# Patient Record
Sex: Female | Born: 1995 | Race: Black or African American | Hispanic: No | Marital: Single | Smoking: Never smoker
Health system: Southern US, Community
[De-identification: ages and names within clinical notes are randomized; demographics above are authoritative.]

## PROBLEM LIST (undated history)

## (undated) DIAGNOSIS — G43909 Migraine, unspecified, not intractable, without status migrainosus: Secondary | ICD-10-CM

## (undated) DIAGNOSIS — F32A Depression, unspecified: Secondary | ICD-10-CM

## (undated) DIAGNOSIS — R51 Headache: Secondary | ICD-10-CM

## (undated) DIAGNOSIS — F419 Anxiety disorder, unspecified: Secondary | ICD-10-CM

## (undated) DIAGNOSIS — E611 Iron deficiency: Secondary | ICD-10-CM

## (undated) DIAGNOSIS — F329 Major depressive disorder, single episode, unspecified: Secondary | ICD-10-CM

## (undated) HISTORY — DX: Major depressive disorder, single episode, unspecified: F32.9

## (undated) HISTORY — DX: Headache: R51

## (undated) HISTORY — DX: Anxiety disorder, unspecified: F41.9

## (undated) HISTORY — DX: Iron deficiency: E61.1

## (undated) HISTORY — PX: HERNIA REPAIR: SHX51

## (undated) HISTORY — PX: SHOULDER ARTHROSCOPY W/ LABRAL REPAIR: SHX2399

## (undated) HISTORY — DX: Depression, unspecified: F32.A

## (undated) HISTORY — DX: Migraine, unspecified, not intractable, without status migrainosus: G43.909

---
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
## 2015-05-03 ENCOUNTER — Encounter (HOSPITAL_COMMUNITY): Payer: Self-pay | Admitting: *Deleted

## 2015-05-03 DIAGNOSIS — Y998 Other external cause status: Secondary | ICD-10-CM | POA: Diagnosis not present

## 2015-05-03 DIAGNOSIS — Y92410 Unspecified street and highway as the place of occurrence of the external cause: Secondary | ICD-10-CM | POA: Insufficient documentation

## 2015-05-03 DIAGNOSIS — Z79899 Other long term (current) drug therapy: Secondary | ICD-10-CM | POA: Diagnosis not present

## 2015-05-03 DIAGNOSIS — Z791 Long term (current) use of non-steroidal anti-inflammatories (NSAID): Secondary | ICD-10-CM | POA: Insufficient documentation

## 2015-05-03 DIAGNOSIS — Y9389 Activity, other specified: Secondary | ICD-10-CM | POA: Diagnosis not present

## 2015-05-03 DIAGNOSIS — Z3202 Encounter for pregnancy test, result negative: Secondary | ICD-10-CM | POA: Insufficient documentation

## 2015-05-03 DIAGNOSIS — S3992XA Unspecified injury of lower back, initial encounter: Secondary | ICD-10-CM | POA: Insufficient documentation

## 2015-05-03 DIAGNOSIS — S3991XA Unspecified injury of abdomen, initial encounter: Secondary | ICD-10-CM | POA: Diagnosis not present

## 2015-05-03 DIAGNOSIS — S199XXA Unspecified injury of neck, initial encounter: Secondary | ICD-10-CM | POA: Insufficient documentation

## 2015-05-03 DIAGNOSIS — S299XXA Unspecified injury of thorax, initial encounter: Secondary | ICD-10-CM | POA: Insufficient documentation
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
## 2015-05-04 ENCOUNTER — Emergency Department (HOSPITAL_COMMUNITY): Payer: Federal, State, Local not specified - PPO

## 2015-05-04 ENCOUNTER — Emergency Department (HOSPITAL_COMMUNITY)
Admission: EM | Admit: 2015-05-04 | Discharge: 2015-05-04 | Disposition: A | Discharge status: Home / Self Care | Payer: Federal, State, Local not specified - PPO | Attending: Emergency Medicine | Admitting: Emergency Medicine

## 2015-05-04 DIAGNOSIS — S199XXA Unspecified injury of neck, initial encounter: Secondary | ICD-10-CM | POA: Diagnosis not present

## 2015-05-04 LAB — URINE MICROSCOPIC-ADD ON

## 2015-05-04 LAB — URINALYSIS, ROUTINE W REFLEX MICROSCOPIC
Bilirubin Urine: NEGATIVE
GLUCOSE, UA: NEGATIVE mg/dL
Ketones, ur: NEGATIVE mg/dL
Leukocytes, UA: NEGATIVE
Nitrite: NEGATIVE
Protein, ur: NEGATIVE mg/dL
SPECIFIC GRAVITY, URINE: 1.009 (ref 1.005–1.030)
Urobilinogen, UA: 0.2 mg/dL (ref 0.0–1.0)
pH: 6 (ref 5.0–8.0)

## 2015-05-04 LAB — POC URINE PREG, ED: PREG TEST UR: NEGATIVE

## 2015-05-04 NOTE — Discharge Instructions (Signed)

## 2015-05-04 NOTE — ED Notes (Signed)
Transported to radiology for CT scan and X-ray.

## 2015-05-04 NOTE — ED Notes (Signed)
Pt A&OX4, ambulatory at d/c with steady gait, NAD 
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
## 2016-07-14 ENCOUNTER — Ambulatory Visit (INDEPENDENT_AMBULATORY_CARE_PROVIDER_SITE_OTHER): Payer: BLUE CROSS/BLUE SHIELD | Admitting: Nurse Practitioner

## 2016-07-14 ENCOUNTER — Encounter: Payer: Self-pay | Admitting: Nurse Practitioner

## 2016-07-14 VITALS — BP 112/66 | HR 64 | Ht 65.75 in | Wt 171.0 lb

## 2016-07-14 DIAGNOSIS — Z0000 Encounter for general adult medical examination without abnormal findings: Secondary | ICD-10-CM

## 2016-07-14 DIAGNOSIS — Z01419 Encounter for gynecological examination (general) (routine) without abnormal findings: Secondary | ICD-10-CM | POA: Diagnosis not present

## 2016-07-14 DIAGNOSIS — N760 Acute vaginitis: Secondary | ICD-10-CM | POA: Diagnosis not present

## 2016-07-14 LAB — POCT URINALYSIS DIPSTICK
BILIRUBIN UA: NEGATIVE
Blood, UA: NEGATIVE
GLUCOSE UA: NEGATIVE
Ketones, UA: NEGATIVE
LEUKOCYTES UA: NEGATIVE
NITRITE UA: NEGATIVE
Protein, UA: NEGATIVE
Urobilinogen, UA: NEGATIVE
pH, UA: 6

## 2016-07-14 MED ORDER — NORETHIN ACE-ETH ESTRAD-FE 1-20 MG-MCG PO TABS: 1.0000 | ORAL_TABLET | Freq: Every day | ORAL | 4 refills | Status: DC

## 2016-07-14 NOTE — Progress Notes (Signed)
Reviewed personally.  M. Suzanne Ryli Standlee, MD.  

## 2016-07-14 NOTE — Progress Notes (Deleted)
Patient ID: Janice Duncan, female   DOB: 1996/04/08, 20 y.o.   MRN: [id number]  20 y.o. G0. Single  African American Fe here for NGYN annual exam. Previously was on Gilldess - Loestrin Fe 1/20.  Menses on the OCP is 4-5 days. Flow is the same off the pill but more irregular at 21 days.  Some cramps, some PMS.  History of migraine better with OCP.  No history of aura.  Last STD's at 3 weeks ago was negative.  Ended last relationship 3 weeks ago and not SA since. She will occasionally have a vaginal odor and wonders if she has BV.  She has had this several times in the past - does not associate infections with time of menses or SA.  Patient's last menstrual period was 06/19/2016 (exact date).          Sexually active: Yes.   3 lifetime partners, sexually active at 74 yo The current method of family planning is condoms all the time.    Exercising: No. No regular exercise.  Works as a Child psychotherapist.  Works out at gym sometimes. Smoker:  Occasional smoker.  Health Maintenance: Pap:  Never per guidelines TDaP: 12/08/15  Gardasil: Injection #06 Apr 2014, needs to complete series - has apt set up to finish immunizations HIV: 06/2016 Labs: done at school  Urine: negative    reports that she has been smoking Cigarettes.  She has never used smokeless tobacco. She reports that she drinks alcohol. She reports that she uses drugs, including Marijuana.  Past Medical History:  Diagnosis Date  . Anxiety   . Depression   . Headache(784.0)    without aura  . Migraine     Past Surgical History:  Procedure Laterality Date  . ABDOMINAL HERNIA REPAIR Bilateral 01/1999   inguinal  . SHOULDER ARTHROSCOPY W/ LABRAL REPAIR Right    2014    Current Outpatient Prescriptions  Medication Sig Dispense Refill  . naproxen (NAPROSYN) 500 MG tablet Take 1 tablet by mouth as needed.    . SUMAtriptan (IMITREX) 50 MG tablet One tablet at the onset of HA, repeat dose in 2 hours if needed max dose  /24hours      No current facility-administered medications for this visit.     Family History  Problem Relation Age of Onset  . Post-traumatic stress disorder Father   . Hypertension Father   . Diabetes Father     prediabetes  . Anxiety disorder Father   . Depression Father   . Diabetes Maternal Grandmother   . Diabetes Paternal Grandmother   . Hypertension Paternal Grandmother   . Lupus Paternal Grandmother   . Depression Paternal Grandmother   . Anxiety disorder Paternal Grandmother   . Pulmonary embolism Sister 20    surgery on leg veins  . Colon cancer Paternal Grandfather   . ADD / ADHD Sister     ROS:  Pertinent items are noted in HPI.  Otherwise, a comprehensive ROS was negative.  Exam:   BP 112/66 (BP Location: Right Arm, Patient Position: Sitting, Cuff Size: Normal)   Pulse 64   Ht 5' 5.75" (1.67 m)   Wt 171 lb (77.6 kg)   LMP 06/19/2016 (Exact Date)   BMI 27.81 kg/m  Height: 5' 5.75" (167 cm) Ht Readings from Last 3 Encounters:  07/14/16 5' 5.75" (1.67 m) (72 %, Z= 0.57)*  09/08/13 5' 6.5" (1.689 m) (82 %, Z= 0.93)*  09/05/13 5' 6.25" (1.683 m) (80 %, Z= 0.83)*   *  Growth percentiles are based on CDC 2-20 Years data.    General appearance: alert, cooperative and appears stated age Head: Normocephalic, without obvious abnormality, atraumatic Neck: no adenopathy, supple, symmetrical, trachea midline and thyroid normal to inspection and palpation Lungs: clear to auscultation bilaterally Breasts: normal appearance, no masses or tenderness Heart: regular rate and rhythm Abdomen: soft, non-tender; no masses,  no organomegaly Extremities: extremities normal, atraumatic, no cyanosis or edema Skin: Skin color, texture, turgor normal. No rashes or lesions Lymph nodes: Cervical, supraclavicular, and axillary nodes normal. No abnormal inguinal nodes palpated Neurologic: Grossly normal   Pelvic: External genitalia:  no lesions              Urethra:  normal appearing urethra  with no masses, tenderness or lesions              Bartholin's and Skene's: normal                 Vagina: normal appearing vagina with normal color and discharge, no lesions              Cervix: anteverted              Pap taken: No. Bimanual Exam:  Uterus:  normal size, contour, position, consistency, mobility, non-tender              Adnexa: no mass, fullness, tenderness               Rectovaginal: Confirms               Anus:  normal sphincter tone, no lesions  Chaperone present: yes  A:  Well Woman with normal exam  Contraception with OCP  Currently off - ran out of pills  Recent STD - 3 wk's ago negative  R/O vaginitis  P:   Reviewed health and wellness pertinent to exam  Pap smear not indicated  New RX for Loestrin Fe 1/20 for a year  Will follow with Affirm test  counseled on breast self exam, STD prevention, HIV risk factors and prevention, use and side effects of OCP's, adequate intake of calcium and vitamin D, diet and exercise return annually or prn  An After Visit Summary was printed and given to the patient.

## 2016-07-14 NOTE — Patient Instructions (Signed)
General topics  Next pap or exam is  due in 1 year Take a Women's multivitamin Take 1200 mg. of calcium daily - prefer dietary If any concerns in interim to call back  Breast Self-Awareness Practicing breast self-awareness may pick up problems early, prevent significant medical complications, and possibly save your life. By practicing breast self-awareness, you can become familiar with how your breasts look and feel and if your breasts are changing. This allows you to notice changes early. It can also offer you some reassurance that your breast health is good. One way to learn what is normal for your breasts and whether your breasts are changing is to do a breast self-exam. If you find a lump or something that was not present in the past, it is best to contact your caregiver right away. Other findings that should be evaluated by your caregiver include nipple discharge, especially if it is bloody; skin changes or reddening; areas where the skin seems to be pulled in (retracted); or new lumps and bumps. Breast pain is seldom associated with cancer (malignancy), but should also be evaluated by a caregiver. BREAST SELF-EXAM The best time to examine your breasts is 5 7 days after your menstrual period is over.  ExitCare Patient Information 2013 [employer].   Exercise to Stay Healthy Exercise helps you become and stay healthy. EXERCISE IDEAS AND TIPS Choose exercises that:  You enjoy.  Fit into your day. You do not need to exercise really hard to be healthy. You can do exercises at a slow or medium level and stay healthy. You can:  Stretch before and after working out.  Try yoga, Pilates, or tai chi.  Lift weights.  Walk fast, swim, jog, run, climb stairs, bicycle, dance, or rollerskate.  Take aerobic classes. Exercises that burn about 150 calories:  Running 1  miles in 15 minutes.  Playing volleyball for 45 to 60 minutes.  Washing and waxing a car for 45 to 60  minutes.  Playing touch football for 45 minutes.  Walking 1  miles in 35 minutes.  Pushing a stroller 1  miles in 30 minutes.  Playing basketball for 30 minutes.  Raking leaves for 30 minutes.  Bicycling 5 miles in 30 minutes.  Walking 2 miles in 30 minutes.  Dancing for 30 minutes.  Shoveling snow for 15 minutes.  Swimming laps for 20 minutes.  Walking up stairs for 15 minutes.  Bicycling 4 miles in 15 minutes.  Gardening for 30 to 45 minutes.  Jumping rope for 15 minutes.  Washing windows or floors for 45 to 60 minutes. Document Released: 12/26/2010 Document Revised: 02/15/2012 Document Reviewed: 12/26/2010 ExitCare Patient Information 2013 [employer].   Other topics ( that may be useful information):    Sexually Transmitted Disease Sexually transmitted disease (STD) refers to any infection that is passed from person to person during sexual activity. This may happen by way of saliva, semen, blood, vaginal mucus, or urine. Common STDs include:  Gonorrhea.  Chlamydia.  Syphilis.  HIV/AIDS.  Genital herpes.  Hepatitis B and C.  Trichomonas.  Human papillomavirus (HPV).  Pubic lice. CAUSES  An STD may be spread by bacteria, virus, or parasite. A person can get an STD by:  Sexual intercourse with an infected person.  Sharing sex toys with an infected person.  Sharing needles with an infected person.  Having intimate contact with the genitals, mouth, or rectal areas of an infected person. SYMPTOMS  Some people may not have any symptoms, but   they can still pass the infection to others. Different STDs have different symptoms. Symptoms include:  Painful or bloody urination.  Pain in the pelvis, abdomen, vagina, anus, throat, or eyes.  Skin rash, itching, irritation, growths, or sores (lesions). These usually occur in the genital or anal area.  Abnormal vaginal discharge.  Penile discharge in men.  Soft, flesh-colored skin growths in the  genital or anal area.  Fever.  Pain or bleeding during sexual intercourse.  Swollen glands in the groin area.  Yellow skin and eyes (jaundice). This is seen with hepatitis. DIAGNOSIS  To make a diagnosis, your caregiver may:  Take a medical history.  Perform a physical exam.  Take a specimen (culture) to be examined.  Examine a sample of discharge under a microscope.  Perform blood test TREATMENT   Chlamydia, gonorrhea, trichomonas, and syphilis can be cured with antibiotic medicine.  Genital herpes, hepatitis, and HIV can be treated, but not cured, with prescribed medicines. The medicines will lessen the symptoms.  Genital warts from HPV can be treated with medicine or by freezing, burning (electrocautery), or surgery. Warts may come back.  HPV is a virus and cannot be cured with medicine or surgery.However, abnormal areas may be followed very closely by your caregiver and may be removed from the cervix, vagina, or vulva through office procedures or surgery. If your diagnosis is confirmed, your recent sexual partners need treatment. This is true even if they are symptom-free or have a negative culture or evaluation. They should not have sex until their caregiver says it is okay. HOME CARE INSTRUCTIONS  All sexual partners should be informed, tested, and treated for all STDs.  Take your antibiotics as directed. Finish them even if you start to feel better.  Only take over-the-counter or prescription medicines for pain, discomfort, or fever as directed by your caregiver.  Rest.  Eat a balanced diet and drink enough fluids to keep your urine clear or pale yellow.  Do not have sex until treatment is completed and you have followed up with your caregiver. STDs should be checked after treatment.  Keep all follow-up appointments, Pap tests, and blood tests as directed by your caregiver.  Only use latex condoms and water-soluble lubricants during sexual activity. Do not use  petroleum jelly or oils.  Avoid alcohol and illegal drugs.  Get vaccinated for HPV and hepatitis. If you have not received these vaccines in the past, talk to your caregiver about whether one or both might be right for you.  Avoid risky sex practices that can break the skin. The only way to avoid getting an STD is to avoid all sexual activity.Latex condoms and dental dams (for oral sex) will help lessen the risk of getting an STD, but will not completely eliminate the risk. SEEK MEDICAL CARE IF:   You have a fever.  You have any new or worsening symptoms. Document Released: 02/13/2003 Document Revised: 02/15/2012 Document Reviewed: 02/20/2011 [facility] Patient Information 2013 [place].    Domestic Abuse You are being battered or abused if someone close to you hits, pushes, or physically hurts you in any way. You also are being abused if you are forced into activities. You are being sexually abused if you are forced to have sexual contact of any kind. You are being emotionally abused if you are made to feel worthless or if you are constantly threatened. It is important to remember that help is available. No one has the right to abuse you. PREVENTION OF FURTHER  ABUSE  Learn the warning signs of danger. This varies with situations but may include: the use of alcohol, threats, isolation from friends and family, or forced sexual contact. Leave if you feel that violence is going to occur.  If you are attacked or beaten, report it to the police so the abuse is documented. You do not have to press charges. The police can protect you while you or the attackers are leaving. Get the officer's name and badge number and a copy of the report.  Find someone you can trust and tell them what is happening to you: your caregiver, a nurse, clergy member, close friend or family member. Feeling ashamed is natural, but remember that you have done nothing wrong. No one deserves abuse. Document Released:  11/20/2000 Document Revised: 02/15/2012 Document Reviewed: 01/29/2011 [facility] Patient Information 2013 [place].    How Much is Too Much Alcohol? Drinking too much alcohol can cause injury, accidents, and health problems. These types of problems can include:   Car crashes.  Falls.  Family fighting (domestic violence).  Drowning.  Fights.  Injuries.  Burns.  Damage to certain organs.  Having a baby with birth defects. ONE DRINK CAN BE TOO MUCH WHEN YOU ARE:  Working.  Pregnant or breastfeeding.  Taking medicines. Ask your doctor.  Driving or planning to drive. If you or someone you know has a drinking problem, get help from a doctor.  Document Released: 09/19/2009 Document Revised: 02/15/2012 Document Reviewed: 09/19/2009 [facility] Patient Information 2013 [place].   Smoking Hazards Smoking cigarettes is extremely bad for your health. Tobacco smoke has over 200 known poisons in it. There are over 60 chemicals in tobacco smoke that cause cancer. Some of the chemicals found in cigarette smoke include:   Cyanide.  Benzene.  Formaldehyde.  Methanol (wood alcohol).  Acetylene (fuel used in welding torches).  Ammonia. Cigarette smoke also contains the poisonous gases nitrogen oxide and carbon monoxide.  Cigarette smokers have an increased risk of many serious medical problems and Smoking causes approximately:  90% of all lung cancer deaths in men.  80% of all lung cancer deaths in women.  90% of deaths from chronic obstructive lung disease. Compared with nonsmokers, smoking increases the risk of:  Coronary heart disease by 2 to 4 times.  Stroke by 2 to 4 times.  Men developing lung cancer by 23 times.  Women developing lung cancer by 13 times.  Dying from chronic obstructive lung diseases by 12 times.  . Smoking is the most preventable cause of death and disease in our society.  WHY IS SMOKING ADDICTIVE?  Nicotine is the chemical  agent in tobacco that is capable of causing addiction or dependence.  When you smoke and inhale, nicotine is absorbed rapidly into the bloodstream through your lungs. Nicotine absorbed through the lungs is capable of creating a powerful addiction. Both inhaled and non-inhaled nicotine may be addictive.  Addiction studies of cigarettes and spit tobacco show that addiction to nicotine occurs mainly during the teen years, when young people begin using tobacco products. WHAT ARE THE BENEFITS OF QUITTING?  There are many health benefits to quitting smoking.   Likelihood of developing cancer and heart disease decreases. Health improvements are seen almost immediately.  Blood pressure, pulse rate, and breathing patterns start returning to normal soon after quitting. QUITTING SMOKING   American Lung Association - 1-800-LUNGUSA  American Cancer Society - 1-800-ACS-2345 Document Released: 12/31/2004 Document Revised: 02/15/2012 Document Reviewed: 09/04/2009 [facility] Patient Information 2013 [place].   Stress Management Stress is a state of physical or mental tension that often results from changes in your life or normal routine. Some common causes of stress are:  Death of a loved one.  Injuries or severe illnesses.  Getting fired or changing jobs.  Moving into a new home. Other causes may be:  Sexual problems.  Business or financial losses.  Taking on a large debt.  Regular conflict with someone at home or at work.  Constant tiredness from lack of sleep. It is not just bad things that are stressful. It may be stressful to:  Win the lottery.  Get married.  Buy a new car. The amount of stress that can be easily tolerated varies from person to person. Changes generally cause stress, regardless of the types of change. Too much stress can affect your health. It may lead to physical or emotional problems. Too little stress (boredom) may also become stressful. SUGGESTIONS TO  REDUCE STRESS:  Talk things over with your family and friends. It often is helpful to share your concerns and worries. If you feel your problem is serious, you may want to get help from a professional counselor.  Consider your problems one at a time instead of lumping them all together. Trying to take care of everything at once may seem impossible. List all the things you need to do and then start with the most important one. Set a goal to accomplish 2 or 3 things each day. If you expect to do too many in a single day you will naturally fail, causing you to feel even more stressed.  Do not use alcohol or drugs to relieve stress. Although you may feel better for a short time, they do not remove the problems that caused the stress. They can also be habit forming.  Exercise regularly - at least 3 times per week. Physical exercise can help to relieve that "uptight" feeling and will relax you.  The shortest distance between despair and hope is often a good night's sleep.  Go to bed and get up on time allowing yourself time for appointments without being rushed.  Take a short "time-out" period from any stressful situation that occurs during the day. Close your eyes and take some deep breaths. Starting with the muscles in your face, tense them, hold it for a few seconds, then relax. Repeat this with the muscles in your neck, shoulders, hand, stomach, back and legs.  Take good care of yourself. Eat a balanced diet and get plenty of rest.  Schedule time for having fun. Take a break from your daily routine to relax. HOME CARE INSTRUCTIONS   Call if you feel overwhelmed by your problems and feel you can no longer manage them on your own.  Return immediately if you feel like hurting yourself or someone else. Document Released: 05/19/2001 Document Revised: 02/15/2012 Document Reviewed: 01/09/2008 ExitCare Patient Information 2013 [employer].  

## 2016-07-15 LAB — WET PREP BY MOLECULAR PROBE
Candida species: NEGATIVE
GARDNERELLA VAGINALIS: NEGATIVE
TRICHOMONAS VAG: NEGATIVE

## 2016-07-15 NOTE — Progress Notes (Signed)
Patient ID: Janice Duncan, female   DOB: 05/07/1996, 19 y.o.   MRN: [id number]  19 y.o. G0. Single  African American Fe here for NGYN annual exam. Previously was on Gilldess - Loestrin Fe 1/20.  Menses on the OCP is 4-5 days. Flow is the same off the pill but more irregular at 21 days.  Some cramps, some PMS.  History of migraine better with OCP.  No history of aura.  Last STD's at 3 weeks ago was negative.  Ended last relationship 3 weeks ago and not SA since. She will occasionally have a vaginal odor and wonders if she has BV.  She has had this several times in the past - does not associate infections with time of menses or SA.  Patient's last menstrual period was 06/19/2016 (exact date).          Sexually active: Yes.   3 lifetime partners, sexually active at 15 yo The current method of family planning is condoms all the time.    Exercising: No. No regular exercise.  Works as a waitress.  Works out at gym sometimes. Smoker:  Occasional smoker.  Health Maintenance: Pap:  Never per guidelines TDaP: 12/08/15  Gardasil: Injection #06 Apr 2014, needs to complete series - has apt set up to finish immunizations HIV: 06/2016 Labs: done at school  Urine: negative    reports that she has been smoking Cigarettes.  She has never used smokeless tobacco. She reports that she drinks alcohol. She reports that she uses drugs, including Marijuana.  Past Medical History:  Diagnosis Date  . Anxiety   . Depression   . Headache(784.0)    without aura  . Migraine     Past Surgical History:  Procedure Laterality Date  . ABDOMINAL HERNIA REPAIR Bilateral 01/1999   inguinal  . SHOULDER ARTHROSCOPY W/ LABRAL REPAIR Right    2014    Current Outpatient Prescriptions  Medication Sig Dispense Refill  . naproxen (NAPROSYN) 500 MG tablet Take 1 tablet by mouth as needed.    . SUMAtriptan (IMITREX) 50 MG tablet One tablet at the onset of HA, repeat dose in 2 hours if needed max dose 200mg /24hours      No current facility-administered medications for this visit.     Family History  Problem Relation Age of Onset  . Post-traumatic stress disorder Father   . Hypertension Father   . Diabetes Father     prediabetes  . Anxiety disorder Father   . Depression Father   . Diabetes Maternal Grandmother   . Diabetes Paternal Grandmother   . Hypertension Paternal Grandmother   . Lupus Paternal Grandmother   . Depression Paternal Grandmother   . Anxiety disorder Paternal Grandmother   . Pulmonary embolism Sister 20    surgery on leg veins  . Colon cancer Paternal Grandfather   . ADD / ADHD Sister     ROS:  Pertinent items are noted in HPI.  Otherwise, a comprehensive ROS was negative.  Exam:   BP 112/66 (BP Location: Right Arm, Patient Position: Sitting, Cuff Size: Normal)   Pulse 64   Ht 5' 5.75" (1.67 m)   Wt 171 lb (77.6 kg)   LMP 06/19/2016 (Exact Date)   BMI 27.81 kg/m  Height: 5' 5.75" (167 cm) Ht Readings from Last 3 Encounters:  07/14/16 5' 5.75" (1.67 m) (72 %, Z= 0.57)*  09/08/13 5' 6.5" (1.689 m) (82 %, Z= 0.93)*  09/05/13 5' 6.25" (1.683 m) (80 %, Z= 0.83)*   *   Growth percentiles are based on CDC 2-20 Years data.    General appearance: alert, cooperative and appears stated age Head: Normocephalic, without obvious abnormality, atraumatic Neck: no adenopathy, supple, symmetrical, trachea midline and thyroid normal to inspection and palpation Lungs: clear to auscultation bilaterally Breasts: normal appearance, no masses or tenderness Heart: regular rate and rhythm Abdomen: soft, non-tender; no masses,  no organomegaly Extremities: extremities normal, atraumatic, no cyanosis or edema Skin: Skin color, texture, turgor normal. No rashes or lesions Lymph nodes: Cervical, supraclavicular, and axillary nodes normal. No abnormal inguinal nodes palpated Neurologic: Grossly normal   Pelvic: External genitalia:  no lesions              Urethra:  normal appearing urethra  with no masses, tenderness or lesions              Bartholin's and Skene's: normal                 Vagina: normal appearing vagina with normal color and discharge, no lesions              Cervix: anteverted              Pap taken: No. Bimanual Exam:  Uterus:  normal size, contour, position, consistency, mobility, non-tender              Adnexa: no mass, fullness, tenderness               Rectovaginal: Confirms               Anus:  normal sphincter tone, no lesions  Chaperone present: yes  A:  Well Woman with normal exam  Contraception with OCP  Currently off - ran out of pills  Recent STD - 3 wk's ago negative  R/O vaginitis  P:   Reviewed health and wellness pertinent to exam  Pap smear not indicated  New RX for Loestrin Fe 1/20 for a year  Will follow with Affirm test  counseled on breast self exam, STD prevention, HIV risk factors and prevention, use and side effects of OCP's, adequate intake of calcium and vitamin D, diet and exercise return annually or prn  An After Visit Summary was printed and given to the patient.

## 2016-12-28 ENCOUNTER — Ambulatory Visit (INDEPENDENT_AMBULATORY_CARE_PROVIDER_SITE_OTHER): Payer: BLUE CROSS/BLUE SHIELD | Admitting: Orthopaedic Surgery

## 2017-01-01 ENCOUNTER — Ambulatory Visit (INDEPENDENT_AMBULATORY_CARE_PROVIDER_SITE_OTHER): Payer: Self-pay

## 2017-01-01 ENCOUNTER — Encounter (INDEPENDENT_AMBULATORY_CARE_PROVIDER_SITE_OTHER): Payer: Self-pay | Admitting: Orthopaedic Surgery

## 2017-01-01 ENCOUNTER — Ambulatory Visit (INDEPENDENT_AMBULATORY_CARE_PROVIDER_SITE_OTHER): Payer: BLUE CROSS/BLUE SHIELD | Admitting: Orthopaedic Surgery

## 2017-01-01 DIAGNOSIS — M25311 Other instability, right shoulder: Secondary | ICD-10-CM | POA: Diagnosis not present

## 2017-01-01 NOTE — Progress Notes (Signed)
Office Visit Note   Patient: Janice Duncan           Date of Birth: 02/25/1996           MRN: [id number] Visit Date: 01/01/2017              Requested by: No referring provider defined for this encounter. PCP: No PCP Per Patient   Assessment & Plan: Visit Diagnoses:  1. Shoulder instability, right     Plan: MRI arthrogram of right shoulder to evaluate for recurrent dislocation.  Follow-Up Instructions: Return in about 10 days (around 01/11/2017).   Orders:  Orders Placed This Encounter  Procedures  . XR Shoulder Right  . MR SHOULDER RIGHT WO CONTRAST   No orders of the defined types were placed in this encounter.     Procedures: No procedures performed   Clinical Data: No additional findings.   Subjective: Chief Complaint  Patient presents with  . Right Shoulder - Pain    Patient is a 21 year old female who has recurrent right shoulder dislocations. She had a Bankart repair by Dr. Rayburn MaBlackmon in 2014 and was doing well until November 2016. She's had multiple dislocations. She denies any pain. She did have a traumatic event about a month ago and dislocated from falling out of her bed. She denies any paresthesias or motor deficits.    Review of Systems Complete review of systems is negative except for history of present illness  Objective: Vital Signs: There were no vitals taken for this visit.  Physical Exam  Constitutional: She is oriented to person, place, and time. She appears well-developed and well-nourished.  HENT:  Head: Normocephalic and atraumatic.  Eyes: EOM are normal.  Neck: Neck supple.  Pulmonary/Chest: Effort normal.  Abdominal: Soft.  Neurological: She is alert and oriented to person, place, and time.  Skin: Skin is warm. Capillary refill takes less than 2 seconds.  Psychiatric: She has a normal mood and affect. Her behavior is normal. Judgment and thought content normal.  Nursing note and vitals reviewed.   Ortho  Exam Exam of the right shoulder shows good passive and active range of motion. She has positive apprehension sign. Rotator cuff testing is normal. Negative Kim test Specialty Comments:  No specialty comments available.  Imaging: Xr Shoulder Right  Result Date: 01/01/2017 No obvious glenoid deficiency. No acute findings.    PMFS History: Patient Active Problem List   Diagnosis Date Noted  . 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 Migraines 04/03/2014   Past Medical History:  Diagnosis Date  . Anxiety   . Depression   . Headache(784.0)    without aura  . Migraine     Family History  Problem Relation Age of Onset  . Post-traumatic stress disorder Father   . Hypertension Father   . Diabetes Father     prediabetes  . Anxiety disorder Father   . Depression Father   . Diabetes Maternal Grandmother   . Diabetes Paternal Grandmother   . Hypertension Paternal Grandmother   . Lupus Paternal Grandmother   . Depression Paternal Grandmother   . Anxiety disorder Paternal Grandmother   . Pulmonary embolism Sister 20    surgery on leg veins  . Colon cancer Paternal Grandfather   . ADD / ADHD Sister     Past Surgical History:  Procedure Laterality Date  . ABDOMINAL HERNIA REPAIR Bilateral 01/1999   inguinal  . SHOULDER ARTHROSCOPY W/ LABRAL REPAIR Right    2014   Social  History   Occupational History  . Not on file.   Social History Main Topics  . Smoking status: Current Some Day Smoker    Types: Cigarettes  . Smokeless tobacco: Never Used  . Alcohol use 0.0 - 0.6 oz/week  . Drug use: Yes    Types: Marijuana  . Sexual activity: Yes    Partners: Male    Birth control/ protection: None, Condom

## 2017-01-04 NOTE — Addendum Note (Signed)
Addended by: Elvina MattesSIMMONS, Elizah Lydon T on: 01/04/2017 02:25 PM   Modules accepted: Orders

## 2017-01-04 NOTE — Addendum Note (Signed)
Addended by: Elvina MattesSIMMONS, Shailene Demonbreun T on: 01/04/2017 08:50 AM   Modules accepted: Orders

## 2017-01-29 ENCOUNTER — Ambulatory Visit
Admission: RE | Admit: 2017-01-29 | Discharge: 2017-01-29 | Disposition: A | Discharge status: Home / Self Care | Payer: BC Managed Care – PPO | Source: Ambulatory Visit | Attending: Orthopaedic Surgery | Admitting: Orthopaedic Surgery

## 2017-01-29 DIAGNOSIS — M25311 Other instability, right shoulder: Secondary | ICD-10-CM

## 2017-01-29 MED ORDER — IOPAMIDOL (ISOVUE-M 200) INJECTION 41%
20.0000 mL | Freq: Once | INTRAMUSCULAR | Status: AC
  Administered 2017-01-29: 20 mL via INTRA_ARTICULAR

## 2018-04-12 DIAGNOSIS — Z832 Family history of diseases of the blood and blood-forming organs and certain disorders involving the immune mechanism: Secondary | ICD-10-CM | POA: Insufficient documentation

## 2018-04-29 ENCOUNTER — Encounter: Payer: Self-pay | Admitting: Hematology and Oncology

## 2018-04-29 ENCOUNTER — Telehealth: Payer: Self-pay | Admitting: Hematology and Oncology

## 2018-04-29 NOTE — Telephone Encounter (Signed)
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 Pt has been scheduled to see Dr. Pamelia Hoit on 6/26 at 345pm. Letter mailed to the pt.

## 2018-06-01 ENCOUNTER — Encounter: Payer: BC Managed Care – PPO | Admitting: Hematology and Oncology

## 2018-06-01 DIAGNOSIS — Z832 Family history of diseases of the blood and blood-forming organs and certain disorders involving the immune mechanism: Secondary | ICD-10-CM

## 2018-10-31 DIAGNOSIS — N920 Excessive and frequent menstruation with regular cycle: Secondary | ICD-10-CM | POA: Insufficient documentation

## 2018-10-31 DIAGNOSIS — F419 Anxiety disorder, unspecified: Secondary | ICD-10-CM | POA: Insufficient documentation

## 2019-01-04 ENCOUNTER — Other Ambulatory Visit: Payer: Self-pay

## 2019-01-04 ENCOUNTER — Encounter (HOSPITAL_COMMUNITY): Payer: Self-pay

## 2019-01-04 ENCOUNTER — Inpatient Hospital Stay (HOSPITAL_COMMUNITY)
Admission: AD | Admit: 2019-01-04 | Discharge: 2019-01-04 | Disposition: A | Discharge status: Home / Self Care | Payer: Federal, State, Local not specified - PPO | Attending: Obstetrics & Gynecology | Admitting: Obstetrics & Gynecology

## 2019-01-04 DIAGNOSIS — N760 Acute vaginitis: Secondary | ICD-10-CM | POA: Diagnosis not present

## 2019-01-04 DIAGNOSIS — B9689 Other specified bacterial agents as the cause of diseases classified elsewhere: Secondary | ICD-10-CM | POA: Diagnosis not present

## 2019-01-04 DIAGNOSIS — F17210 Nicotine dependence, cigarettes, uncomplicated: Secondary | ICD-10-CM | POA: Diagnosis not present

## 2019-01-04 DIAGNOSIS — B3731 Acute candidiasis of vulva and vagina: Secondary | ICD-10-CM

## 2019-01-04 DIAGNOSIS — B373 Candidiasis of vulva and vagina: Secondary | ICD-10-CM | POA: Diagnosis not present

## 2019-01-04 DIAGNOSIS — Z3202 Encounter for pregnancy test, result negative: Secondary | ICD-10-CM | POA: Diagnosis not present

## 2019-01-04 DIAGNOSIS — N898 Other specified noninflammatory disorders of vagina: Secondary | ICD-10-CM | POA: Diagnosis present

## 2019-01-04 LAB — URINALYSIS, ROUTINE W REFLEX MICROSCOPIC
Bacteria, UA: NONE SEEN
Bilirubin Urine: NEGATIVE
Glucose, UA: NEGATIVE mg/dL
KETONES UR: NEGATIVE mg/dL
Nitrite: NEGATIVE
PROTEIN: NEGATIVE mg/dL
Specific Gravity, Urine: 1.015 (ref 1.005–1.030)
pH: 6 (ref 5.0–8.0)

## 2019-01-04 LAB — HCG, QUANTITATIVE, PREGNANCY: hCG, Beta Chain, Quant, S: <1 m[IU]/mL (ref <5)

## 2019-01-04 LAB — WET PREP, GENITAL
Sperm: NONE SEEN
Trich, Wet Prep: NONE SEEN

## 2019-01-04 LAB — POCT PREGNANCY, URINE: Preg Test, Ur: NEGATIVE

## 2019-01-04 MED ORDER — FLUCONAZOLE 150 MG PO TABS: 150.0000 mg | ORAL_TABLET | Freq: Every day | ORAL | 1 refills | Status: DC

## 2019-01-04 NOTE — MAU Provider Note (Signed)
Chief Complaint: Possible Pregnancy and Vaginal Discharge   First Provider Initiated Contact with Patient 01/04/19 2155     SUBJECTIVE HPI: Janice Duncan is a 23 y.o. non pregnant female who presents to Maternity Admissions reporting vaginal discharge and possible pregnancy. Had positive home pregnancy test last week. LMP was 11/26/19. Was seen at [facility] on 1/13 & had STD testing & and HCG which were all negative.  Has not had intercourse since that visit. Reports brown mucoid discharge. No itching or irritation. No abdominal pain.   Past Medical History:  Diagnosis Date  . Anxiety   . Depression   . Headache(784.0)    without aura  . Migraine    OB History  Gravida Para Term Preterm AB Living  1       1 0  SAB TAB Ectopic Multiple Live Births  1            # Outcome Date GA Lbr Len/2nd Weight Sex Delivery Anes PTL Lv  1 SAB 02/2016            Obstetric Comments  Unknown gestation    Past Surgical History:  Procedure Laterality Date  . ABDOMINAL HERNIA REPAIR Bilateral 01/1999   inguinal  . SHOULDER ARTHROSCOPY W/ LABRAL REPAIR Right    2014   Social History   Socioeconomic History  . Marital status: Single    Spouse name: Not on file  . Number of children: Not on file  . Years of education: Not on file  . Highest education level: Not on file  Occupational History  . Not on file  Social Needs  . Financial resource strain: Not on file  . Food insecurity:    Worry: Not on file    Inability: Not on file  . Transportation needs:    Medical: Not on file    Non-medical: Not on file  Tobacco Use  . Smoking status: Current Some Day Smoker    Types: Cigarettes  . Smokeless tobacco: Never Used  Substance and Sexual Activity  . Alcohol use: Yes    Alcohol/week: 0.0 - 1.0 standard drinks  . Drug use: Yes    Types: Marijuana  . Sexual activity: Yes    Partners: Male    Birth control/protection: None, Condom  Lifestyle  . Physical activity:    Days  per week: Not on file    Minutes per session: Not on file  . Stress: Not on file  Relationships  . Social connections:    Talks on phone: Not on file    Gets together: Not on file    Attends religious service: Not on file    Active member of club or organization: Not on file    Attends meetings of clubs or organizations: Not on file    Relationship status: Not on file  . Intimate partner violence:    Fear of current or ex partner: Not on file    Emotionally abused: Not on file    Physically abused: Not on file    Forced sexual activity: Not on file  Other Topics Concern  . Not on file  Social History Narrative  . Not on file   Family History  Problem Relation Age of Onset  . Post-traumatic stress disorder Father   . Hypertension Father   . Diabetes Father        prediabetes  . Anxiety disorder Father   . Depression Father   . Diabetes Maternal Grandmother   .  Diabetes Paternal Grandmother   . Hypertension Paternal Grandmother   . Lupus Paternal Grandmother   . Depression Paternal Grandmother   . Anxiety disorder Paternal Grandmother   . Pulmonary embolism Sister 20       surgery on leg veins  . Colon cancer Paternal Grandfather   . ADD / ADHD Sister    No current facility-administered medications on file prior to encounter.    Current Outpatient Medications on File Prior to Encounter  Medication Sig Dispense Refill  . naproxen (NAPROSYN) 500 MG tablet Take 1 tablet by mouth as needed.    . norethindrone-ethinyl estradiol (JUNEL FE,GILDESS FE,LOESTRIN FE) 1-20 MG-MCG tablet Take 1 tablet by mouth daily. 3 Package 4  . sertraline (ZOLOFT) 25 MG tablet Take 25 mg by mouth daily. Last taken 2 days ago due to pos HPT.    . SUMAtriptan (IMITREX) 50 MG tablet One tablet at the onset of HA, repeat dose in 2 hours if needed max dose 200mg  /24hours     Allergies  Allergen Reactions  . Tape     Adhesive     I have reviewed patient's Past Medical Hx, Surgical Hx, Family Hx,  Social Hx, medications and allergies.   Review of Systems  Constitutional: Negative.   Gastrointestinal: Negative.   Genitourinary: Positive for vaginal discharge. Negative for dysuria and vaginal bleeding.    OBJECTIVE Patient Vitals for the past 24 hrs:  BP Temp Temp src Pulse Resp SpO2 Height Weight  01/04/19 2328 123/79 - - 86 18 99 % - -  01/04/19 2327 (!) 114/93 - - 88 - - - -  01/04/19 2134 131/71 98.3 F (36.8 C) Oral 87 19 - - 73.7 kg  01/04/19 2100 132/82 98.2 F (36.8 C) Oral (!) 112 18 100 % 5\' 5"  (1.651 m) 73.5 kg   Constitutional: Well-developed, well-nourished female in no acute distress.  Cardiovascular: normal rate & rhythm, no murmur Respiratory: normal rate and effort. Lung sounds clear throughout GI: Abd soft, non-tender, Pos BS x 4. No guarding or rebound tenderness MS: Extremities nontender, no edema, normal ROM Neurologic: Alert and oriented x 4.      LAB RESULTS Results for orders placed or performed during the hospital encounter of 01/04/19 (from the past 24 hour(s))  Urinalysis, Routine w reflex microscopic     Status: Abnormal   Collection Time: 01/04/19  9:05 PM  Result Value Ref Range   Color, Urine YELLOW YELLOW   APPearance CLEAR CLEAR   Specific Gravity, Urine 1.015 1.005 - 1.030   pH 6.0 5.0 - 8.0   Glucose, UA NEGATIVE NEGATIVE mg/dL   Hgb urine dipstick SMALL (A) NEGATIVE   Bilirubin Urine NEGATIVE NEGATIVE   Ketones, ur NEGATIVE NEGATIVE mg/dL   Protein, ur NEGATIVE NEGATIVE mg/dL   Nitrite NEGATIVE NEGATIVE   Leukocytes, UA SMALL (A) NEGATIVE   RBC / HPF 0-5 0 - 5 RBC/hpf   WBC, UA 0-5 0 - 5 WBC/hpf   Bacteria, UA NONE SEEN NONE SEEN   Squamous Epithelial / LPF 0-5 0 - 5   Mucus PRESENT   Pregnancy, urine POC     Status: None   Collection Time: 01/04/19  9:16 PM  Result Value Ref Range   Preg Test, Ur NEGATIVE NEGATIVE  Wet prep, genital     Status: Abnormal   Collection Time: 01/04/19 10:11 PM  Result Value Ref Range   Yeast  Wet Prep HPF POC PRESENT (A) NONE SEEN   Trich, Wet Prep  NONE SEEN NONE SEEN   Clue Cells Wet Prep HPF POC PRESENT (A) NONE SEEN   WBC, Wet Prep HPF POC TOO NUMEROUS TO COUNT (A) NONE SEEN   Sperm NONE SEEN   hCG, quantitative, pregnancy     Status: None   Collection Time: 01/04/19 10:14 PM  Result Value Ref Range   hCG, Beta Chain, Quant, S <1 <5 mIU/mL    IMAGING No results found.  MAU COURSE Orders Placed This Encounter  Procedures  . Wet prep, genital  . Urinalysis, Routine w reflex microscopic  . hCG, quantitative, pregnancy  . Pregnancy, urine POC  . Discharge patient   Meds ordered this encounter  Medications  . fluconazole (DIFLUCAN) 150 MG tablet    Sig: Take 1 tablet (150 mg total) by mouth daily.    Dispense:  1 tablet    Refill:  1    Order Specific Question:   Supervising Provider    Answer:   Despina HiddenEURE, LUTHER H [[id number]]    MDM UPT negative HCG <1 Wet prep + clue cells & yeast. Patient was prescribed flagyl a few weeks ago but never took it b/c she thought she was pregnant. Still has meds and will take. Will rx diflucan for yeast  ASSESSMENT 1. Negative pregnancy test   2. BV (bacterial vaginosis)   3. Vaginal yeast infection     PLAN Discharge home in stable condition.   Allergies as of 01/04/2019      Reactions   Tape    Adhesive       Medication List    TAKE these medications   fluconazole 150 MG tablet Commonly known as:  DIFLUCAN Take 1 tablet (150 mg total) by mouth daily.   naproxen 500 MG tablet Commonly known as:  NAPROSYN Take 1 tablet by mouth as needed.   norethindrone-ethinyl estradiol 1-20 MG-MCG tablet Commonly known as:  JUNEL FE,GILDESS FE,LOESTRIN FE Take 1 tablet by mouth daily.   sertraline 25 MG tablet Commonly known as:  ZOLOFT Take 25 mg by mouth daily. Last taken 2 days ago due to pos HPT.   SUMAtriptan 50 MG tablet Commonly known as:  IMITREX One tablet at the onset of HA, repeat dose in 2 hours if needed max  dose 200mg  Coral Ceo/24hours        Robertine Kipper, NP 01/04/2019  11:35 PM

## 2019-01-04 NOTE — MAU Note (Signed)
Pt presents to MAU c/o brownish discharge that started yesterday and has been consistent today.  Pt reports a +pregnacy test last week. LMP 11/25/18. Pt reports a slight cramping. Hx of miscarriage. Pt reports frequent urination no burning. Nausea, indigestion, and fatigue. Pt reports - STI on Dec 19 2018 @ [facility].

## 2019-01-04 NOTE — Discharge Instructions (Signed)
In late 2019, the [facility] will be [facility] to the [facility]. At that time, the MAU (Maternity Admissions Unit), where you are being seen today, will no longer take care of non-pregnant patients. We strongly encourage you to find a doctor's office before that time, so that you can be seen with any GYN concerns, like vaginal discharge, urinary tract infection, etc.. in a timely manner.  In order to make an office visit more convenient, the Center for [facility] at [facility] will be offering evening hours with same-day appointments, walk-in appointments and scheduled appointments available during this time.  Center for [facility] Hours: Monday - 8am - 7:30 pm with walk-in between 4pm- 7:30 pm Tuesday - 8 am - 5 pm (starting 03/08/18 we will be open late and accepting walk-ins from 4pm - 7:30pm) Wednesday - 8 am - 5 pm (starting 06/08/18 we will be open late and accepting walk-ins from 4pm - 7:30pm) Thursday 8 am - 5 pm (starting 09/08/18 we will be open late and accepting walk-ins from 4pm - 7:30pm) Friday 8 am - 5 pm  For an appointment please call the [facility] @ [facility] at [phone]  For urgent needs, [facility] is also available for management of urgent GYN complaints such as vaginal discharge or urinary tract infections.       Bacterial Vaginosis  Bacterial vaginosis is an infection of the vagina. It happens when too many normal germs (healthy bacteria) grow in the vagina. This infection puts you at risk for infections from sex (STIs). Treating this infection can lower your risk for some STIs. You should also treat this if you are pregnant. It can cause your baby to be born early. Follow these instructions at home: Medicines  Take over-the-counter and prescription medicines only as told by your doctor.  Take or use your antibiotic medicine as told by your doctor. Do not stop taking or using  it even if you start to feel better. General instructions  If you your sexual partner is a woman, tell her that you have this infection. She needs to get treatment if she has symptoms. If you have a female partner, he does not need to be treated.  During treatment: ? Avoid sex. ? Do not douche. ? Avoid alcohol as told. ? Avoid breastfeeding as told.  Drink enough fluid to keep your pee (urine) clear or pale yellow.  Keep your vagina and butt (rectum) clean. ? Wash the area with warm water every day. ? Wipe from front to back after you use the toilet.  Keep all follow-up visits as told by your doctor. This is important. Preventing this condition  Do not douche.  Use only warm water to wash around your vagina.  Use protection when you have sex. This includes: ? Latex condoms. ? Dental dams.  Limit how many people you have sex with. It is best to only have sex with the same person (be monogamous).  Get tested for STIs. Have your partner get tested.  Wear underwear that is cotton or lined with cotton.  Avoid tight pants and pantyhose. This is most important in summer.  Do not use any products that have nicotine or tobacco in them. These include cigarettes and e-cigarettes. If you need help quitting, ask your doctor.  Do not use illegal drugs.  Limit how much alcohol you drink. Contact a doctor if:  Your symptoms do not get better, even after  you are treated.  You have more discharge or pain when you pee (urinate).  You have a fever.  You have pain in your belly (abdomen).  You have pain with sex.  Your bleed from your vagina between periods. Summary  This infection happens when too many germs (bacteria) grow in the vagina.  Treating this condition can lower your risk for some infections from sex (STIs).  You should also treat this if you are pregnant. It can cause early (premature) birth.  Do not stop taking or using your antibiotic medicine even if you start  to feel better. This information is not intended to replace advice given to you by your health care provider. Make sure you discuss any questions you have with your health care provider. Document Released: 09/01/2008 Document Revised: 08/08/2016 Document Reviewed: 08/08/2016 Elsevier Interactive Patient Education  2019 [employer]  Vaginal Yeast infection, Adult  Vaginal yeast infection is a condition that causes vaginal discharge as well as soreness, swelling, and redness (inflammation) of the vagina. This is a common condition. Some women get this infection frequently. What are the causes? This condition is caused by a change in the normal balance of the yeast (candida) and bacteria that live in the vagina. This change causes an overgrowth of yeast, which causes the inflammation. What increases the risk? The condition is more likely to develop in women who:  Take antibiotic medicines.  Have diabetes.  Take birth control pills.  Are pregnant.  Douche often.  Have a weak body defense system (immune system).  Have been taking steroid medicines for a long time.  Frequently wear tight clothing. What are the signs or symptoms? Symptoms of this condition include:  White, thick, creamy vaginal discharge.  Swelling, itching, redness, and irritation of the vagina. The lips of the vagina (vulva) may be affected as well.  Pain or a burning feeling while urinating.  Pain during sex. How is this diagnosed? This condition is diagnosed based on:  Your medical history.  A physical exam.  A pelvic exam. Your health care provider will examine a sample of your vaginal discharge under a microscope. Your health care provider may send this sample for testing to confirm the diagnosis. How is this treated? This condition is treated with medicine. Medicines may be over-the-counter or prescription. You may be told to use one or more of the following:  Medicine that is taken by mouth  (orally).  Medicine that is applied as a cream (topically).  Medicine that is inserted directly into the vagina (suppository). Follow these instructions at home:  Lifestyle  Do not have sex until your health care provider approves. Tell your sex partner that you have a yeast infection. That person should go to his or her health care provider and ask if they should also be treated.  Do not wear tight clothes, such as pantyhose or tight pants.  Wear breathable cotton underwear. General instructions  Take or apply over-the-counter and prescription medicines only as told by your health care provider.  Eat more yogurt. This may help to keep your yeast infection from returning.  Do not use tampons until your health care provider approves.  Try taking a sitz bath to help with discomfort. This is a warm water bath that is taken while you are sitting down. The water should only come up to your hips and should cover your buttocks. Do this 3-4 times per day or as told by your health care provider.  Do not douche.  If you have diabetes, keep your blood sugar levels under control.  Keep all follow-up visits as told by your health care provider. This is important. Contact a health care provider if:  You have a fever.  Your symptoms go away and then return.  Your symptoms do not get better with treatment.  Your symptoms get worse.  You have new symptoms.  You develop blisters in or around your vagina.  You have blood coming from your vagina and it is not your menstrual period.  You develop pain in your abdomen. Summary  Vaginal yeast infection is a condition that causes discharge as well as soreness, swelling, and redness (inflammation) of the vagina.  This condition is treated with medicine. Medicines may be over-the-counter or prescription.  Take or apply over-the-counter and prescription medicines only as told by your health care provider.  Do not douche. Do not have sex or  use tampons until your health care provider approves.  Contact a health care provider if your symptoms do not get better with treatment or your symptoms go away and then return. This information is not intended to replace advice given to you by your health care provider. Make sure you discuss any questions you have with your health care provider. Document Released: 09/02/2005 Document Revised: 04/11/2018 Document Reviewed: 04/11/2018 Elsevier Interactive Patient Education  2019 [employer].

## 2019-05-29 ENCOUNTER — Other Ambulatory Visit: Payer: Self-pay

## 2019-05-31 ENCOUNTER — Ambulatory Visit: Payer: Federal, State, Local not specified - PPO | Admitting: Obstetrics and Gynecology

## 2019-05-31 ENCOUNTER — Other Ambulatory Visit: Payer: Self-pay

## 2019-05-31 ENCOUNTER — Encounter: Payer: Self-pay | Admitting: Obstetrics and Gynecology

## 2019-05-31 VITALS — BP 110/64 | HR 14 | Temp 98.0°F | Resp 14 | Ht 67.0 in | Wt 153.2 lb

## 2019-05-31 DIAGNOSIS — N921 Excessive and frequent menstruation with irregular cycle: Secondary | ICD-10-CM | POA: Diagnosis not present

## 2019-05-31 DIAGNOSIS — N946 Dysmenorrhea, unspecified: Secondary | ICD-10-CM

## 2019-05-31 DIAGNOSIS — Z8759 Personal history of other complications of pregnancy, childbirth and the puerperium: Secondary | ICD-10-CM | POA: Diagnosis not present

## 2019-05-31 DIAGNOSIS — G43829 Menstrual migraine, not intractable, without status migrainosus: Secondary | ICD-10-CM

## 2019-05-31 MED ORDER — IBUPROFEN 800 MG PO TABS: 800.0000 mg | ORAL_TABLET | Freq: Three times a day (TID) | ORAL | 2 refills | Status: AC | PRN

## 2019-05-31 NOTE — Patient Instructions (Signed)
Menorrhagia ° °Menorrhagia is a condition in which menstrual periods are heavy or last longer than normal. With menorrhagia, most periods a woman has may cause enough blood loss and cramping that she becomes unable to take part in her usual activities. °What are the causes? °Common causes of this condition include: °· Noncancerous growths in the uterus (polyps or fibroids). °· An imbalance of the estrogen and progesterone hormones. °· One of the ovaries not releasing an egg during one or more months. °· A problem with the thyroid gland (hypothyroid). °· Side effects of having an intrauterine device (IUD). °· Side effects of some medicines, such as anti-inflammatory medicines or blood thinners. °· A bleeding disorder that stops the blood from clotting normally. °In some cases, the cause of this condition is not known. °What are the signs or symptoms? °Symptoms of this condition include: °· Routinely having to change your pad or tampon every 1-2 hours because it is completely soaked. °· Needing to use pads and tampons at the same time because of heavy bleeding. °· Needing to wake up to change your pads or tampons during the night. °· Passing blood clots larger than 1 inch (2.5 cm) in size. °· Having bleeding that lasts for more than 7 days. °· Having symptoms of low iron levels (anemia), such as tiredness, fatigue, or shortness of breath. °How is this diagnosed? °This condition may be diagnosed based on: °· A physical exam. °· Your symptoms and menstrual history. °· Tests, such as: °? Blood tests to check if you are pregnant or have hormonal changes, a bleeding or thyroid disorder, anemia, or other problems. °? Pap test to check for cancerous changes, infections, or inflammation. °? Endometrial biopsy. This test involves removing a tissue sample from the lining of the uterus (endometrium) to be examined under a microscope. °? Pelvic ultrasound. This test uses sound waves to create images of your uterus, ovaries, and  vagina. The images can show if you have fibroids or other growths. °? Hysteroscopy. For this test, a small telescope is used to look inside your uterus. °How is this treated? °Treatment may not be needed for this condition. If it is needed, the best treatment for you will depend on: °· Whether you need to prevent pregnancy. °· Your desire to have children in the future. °· The cause and severity of your bleeding. °· Your personal preference. °Medicines are the first step in treatment. You may be treated with: °· Hormonal birth control methods. These treatments reduce bleeding during your menstrual period. They include: °? Birth control pills. °? Skin patch. °? Vaginal ring. °? Shots (injections) that you get every 3 months. °? Hormonal IUD (intrauterine device). °? Implants that go under the skin. °· Medicines that thicken blood and slow bleeding. °· Medicines that reduce swelling, such as ibuprofen. °· Medicines that contain an artificial (synthetic) hormone called progestin. °· Medicines that make the ovaries stop working for a short time. °· Iron supplements to treat anemia. °If medicines do not work, surgery may be done. Surgical options may include: °· Dilation and curettage (D&C). In this procedure, your health care provider opens (dilates) your cervix and then scrapes or suctions tissue from the endometrium to reduce menstrual bleeding. °· Operative hysteroscopy. In this procedure, a small tube with a light on the end (hysteroscope) is used to view your uterus and help remove polyps that may be causing heavy periods. °· Endometrial ablation. This is when various techniques are used to permanently destroy your entire endometrium.   After endometrial ablation, most women have little or no menstrual flow. This procedure reduces your ability to become pregnant.  Endometrial resection. In this procedure, an electrosurgical wire loop is used to remove the endometrium. This procedure reduces your ability to become  pregnant.  Hysterectomy. This is surgical removal of the uterus. This is a permanent procedure that stops menstrual periods. Pregnancy is not possible after a hysterectomy. Follow these instructions at home: Medicines  Take over-the-counter and prescription medicines exactly as told by your health care provider. This includes iron pills.  Do not change or switch medicines without asking your health care provider.  Do not take aspirin or medicines that contain aspirin 1 week before or during your menstrual period. Aspirin may make bleeding worse. General instructions  If you need to change your sanitary pad or tampon more than once every 2 hours, limit your activity until the bleeding stops.  Iron pills can cause constipation. To prevent or treat constipation while you are taking prescription iron supplements, your health care provider may recommend that you: ? Drink enough fluid to keep your urine clear or pale yellow. ? Take over-the-counter or prescription medicines. ? Eat foods that are high in fiber, such as fresh fruits and vegetables, whole grains, and beans. ? Limit foods that are high in fat and processed sugars, such as fried and sweet foods.  Eat well-balanced meals, including foods that are high in iron. Foods that have a lot of iron include leafy green vegetables, meat, liver, eggs, and whole grain breads and cereals.  Do not try to lose weight until the abnormal bleeding has stopped and your blood iron level is back to normal. If you need to lose weight, work with your health care provider to lose weight safely.  Keep all follow-up visits as told by your health care provider. This is important. Contact a health care provider if:  You soak through a pad or tampon every 1 or 2 hours, and this happens every time you have a period.  You need to use pads and tampons at the same time because you are bleeding so much.  You have nausea, vomiting, diarrhea, or other problems  related to medicines you are taking. Get help right away if:  You soak through more than a pad or tampon in 1 hour.  You pass clots bigger than 1 inch (2.5 cm) wide.  You feel short of breath.  You feel like your heart is beating too fast.  You feel dizzy or faint.  You feel very weak or tired. Summary  Menorrhagia is a condition in which menstrual periods are heavy or last longer than normal.  Treatment will depend on the cause of the condition and may include medicines or procedures.  Take over-the-counter and prescription medicines exactly as told by your health care provider. This includes iron pills.  Get help right away if you have heavy bleeding that soaks through more than a pad or tampon in 1 hour, you are passing large clots, or you feel dizzy, faint or short of breath. This information is not intended to replace advice given to you by your health care provider. Make sure you discuss any questions you have with your health care provider. Document Released: 11/23/2005 Document Revised: 11/16/2016 Document Reviewed: 11/16/2016 Elsevier Interactive Patient Education  2019 [employer] Dysmenorrhea  Dysmenorrhea refers to cramps caused by the muscles of the uterus tightening (contracting) during a menstrual period. Dysmenorrhea may be mild, or it may be severe enough   to interfere with everyday activities for a few days each month. Primary dysmenorrhea is menstrual cramps that last a couple of days when you start having menstrual periods or soon after. This often begins after a teenager starts having her period. As a woman gets older or has a baby, the cramps will usually lessen or disappear. Secondary dysmenorrhea begins later in life and is caused by a disorder in the reproductive system. It lasts longer, and it may cause more pain than primary dysmenorrhea. The pain may start before the period and last a few days after the period. What are the causes? Dysmenorrhea is usually  caused by an underlying problem, such as:  The tissue that lines the uterus (endometrium) growing outside of the uterus in other areas of the body (endometriosis).  Endometrial tissue growing into the muscular walls of the uterus (adenomyosis).  Blood vessels in the pelvis becoming filled with blood just before the menstrual period (pelvic congestive syndrome).  Overgrowth of cells (polyps) in the endometrium or the lower part of the uterus (cervix).  The uterus dropping down into the vagina (prolapse) due to stretched or weak muscles.  Bladder problems, such as infection or inflammation.  Intestinal problems, such as a tumor or irritable bowel syndrome.  Cancer of the reproductive organs or bladder.  A severely tipped uterus.  A cervix that is closed or has a very small opening.  Noncancerous (benign) tumors of the uterus (fibroids).  Pelvic inflammatory disease (PID).  Pelvic scarring (adhesions) from a previous surgery.  An ovarian cyst.  An IUD (intrauterine device). What increases the risk? You are more likely to develop this condition if:  You are younger than age 30.  You started puberty early.  You have irregular or heavy bleeding.  You have never given birth.  You have a family history of dysmenorrhea.  You smoke. What are the signs or symptoms? Symptoms of this condition include:  Cramping, throbbing pain, or a feeling of fullness in the lower abdomen.  Lower back pain.  Periods lasting for longer than 7 days.  Headaches.  Bloating.  Fatigue.  Nausea or vomiting.  Diarrhea.  Sweating or dizziness.  Loose stools. How is this diagnosed? This condition may be diagnosed based on:  Your symptoms.  Your medical history.  A physical exam.  Blood tests.  A Pap test. This is a test in which cells from the cervix are tested for signs of cancer or infection.  A pregnancy test.  Imaging tests, such as: ? Ultrasound. ? A procedure to  remove and examine a sample of endometrial tissue (dilation and curettage, D&C). ? A procedure to visually examine the inside of:  The uterus (hysteroscopy).  The abdomen or pelvis (laparoscopy).  The bladder (cystoscopy).  The intestine (colonoscopy).  The stomach (gastroscopy). ? X-rays. ? CT scan. ? MRI. How is this treated? Treatment depends on the cause of the dysmenorrhea. Treatment may include:  Pain medicine prescribed by your health care provider.  Birth control pills that contain the hormone progesterone.  An IUD that contains the hormone progesterone.  Medicines to control bleeding.  Hormone replacement therapy.  NSAIDs. These may help to stop the production of hormones that cause cramps.  Antidepressant medicines.  Surgery to remove adhesions, endometriosis, ovarian cysts, fibroids, or the entire uterus (hysterectomy).  Injections of progesterone to stop the menstrual period.  A procedure to destroy the endometrium (endometrial ablation).  A procedure to cut the nerves in the bottom of the spine (sacrum)   that go to the reproductive organs (presacral neurectomy).  A procedure to apply an electric current to nerves in the sacrum (sacral nerve stimulation).  Exercise and physical therapy.  Meditation and yoga therapy.  Acupuncture. Work with your health care provider to determine what treatment or combination of treatments is best for you. Follow these instructions at home: Relieving pain and cramping  Apply heat to your lower back or abdomen when you experience pain or cramps. Use the heat source that your health care provider recommends, such as a moist heat pack or a heating pad. ? Place a towel between your skin and the heat source. ? Leave the heat on for 20-30 minutes. ? Remove the heat if your skin turns bright red. This is especially important if you are unable to feel pain, heat, or cold. You may have a greater risk of getting burned. ? Do not  sleep with a heating pad on.  Do aerobic exercises, such as walking, swimming, or biking. This can help to relieve cramps.  Massage your lower back or abdomen to help relieve pain. General instructions  Take over-the-counter and prescription medicines only as told by your health care provider.  Do not drive or use heavy machinery while taking prescription pain medicine.  Avoid alcohol and caffeine during and right before your menstrual period. These can make cramps worse.  Do not use any products that contain nicotine or tobacco, such as cigarettes and e-cigarettes. If you need help quitting, ask your health care provider.  Keep all follow-up visits as told by your health care provider. This is important. Contact a health care provider if:  You have pain that gets worse or does not get better with medicine.  You have pain with sex.  You develop nausea or vomiting with your period that is not controlled with medicine. Get help right away if:  You faint. Summary  Dysmenorrhea refers to cramps caused by the muscles of the uterus tightening (contracting) during a menstrual period.  Dysmenorrhea may be mild, or it may be severe enough to interfere with everyday activities for a few days each month.  Treatment depends on the cause of the dysmenorrhea.  Work with your health care provider to determine what treatment or combination of treatments is best for you. This information is not intended to replace advice given to you by your health care provider. Make sure you discuss any questions you have with your health care provider. Document Released: 11/23/2005 Document Revised: 12/26/2016 Document Reviewed: 12/26/2016 Elsevier Interactive Patient Education  2019 [employer].

## 2019-05-31 NOTE — Progress Notes (Signed)
GYNECOLOGY  VISIT   HPI: 23 y.o.   Single  African American  female   [id number] with Patient's last menstrual period was 05/08/2019.   Here as a new patient for pelvic pain; patient has painful periods that last about 8-9 days with heavy bleeding; patient had a miscarriage in February. Patient was prescribed Junel FE, however she has still had persistent issues with menses. Per patient, is no longer taking the JUNEL and would like to discuss getting the Paragard IUD  Was supposed to have a pelvic US done this week, and she missed the appointment at an outside office.    She has cramping and low back pain on her menses.  Her menses are regular and occur every 4 -5 weeks.  Pad change every 1.5 hours.  Some fatigue.   Junel does not help with her menstruation. Upset stomach during her cycle.  She has menstrual migraines.  The occur prior a to her cycle starting and last for some days.  She does not have aura.  She can have migraine outside of her cycle.   She needs a refill of Imitrex.  Her PCP gives her this Rx.   Denies HTN, liver or breast disease, no personal hx thromboembolic events.  Sister with PE on OCPs.   UPT - negative.   PCP - Dani GobbleSara Spencer Carter.   GYNECOLOGIC HISTORY: Patient's last menstrual period was 05/08/2019. Contraception:  Condoms  Menopausal hormone therapy:  n/a Last mammogram:  n/a Last pap smear:   05/24/18 Negative -- see [facility]        OB History    Gravida  1   Para      Term      Preterm      AB  1   Living  0     SAB  1   TAB      Ectopic      Multiple      Live Births           Obstetric Comments  Unknown gestation            Patient Active Problem List   Diagnosis Date Noted  . Family history of clotting disorder 06/01/2018  . 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 Migraines 04/03/2014    Past Medical History:  Diagnosis Date  . Anxiety   . Depression   . Headache(784.0)     without aura  . Migraine     Past Surgical History:  Procedure Laterality Date  . ABDOMINAL HERNIA REPAIR Bilateral 01/1999   inguinal  . HERNIA REPAIR    . SHOULDER ARTHROSCOPY W/ LABRAL REPAIR Right    2014    Current Outpatient Medications  Medication Sig Dispense Refill  . Biotin w/ Vitamins C & E (HAIR/SKIN/NAILS PO) Take by mouth.    . CRANBERRY PO Take by mouth.    . Multiple Vitamins-Minerals (MULTIVITAMIN ADULT PO) Take by mouth.    . naproxen (NAPROSYN) 500 MG tablet Take 1 tablet by mouth as needed.    . sertraline (ZOLOFT) 25 MG tablet Take 25 mg by mouth daily. Last taken 2 days ago due to pos HPT.    . SUMAtriptan (IMITREX) 50 MG tablet One tablet at the onset of HA, repeat dose in 2 hours if needed max dose 200mg  /24hours    . norethindrone-ethinyl estradiol (JUNEL FE,GILDESS FE,LOESTRIN FE) 1-20 MG-MCG tablet Take 1 tablet by mouth daily. (Patient not taking: Reported on 05/31/2019) 3  Package 4   No current facility-administered medications for this visit.      ALLERGIES: Latex and Tape  Family History  Problem Relation Age of Onset  . Post-traumatic stress disorder Father   . Hypertension Father   . Diabetes Father        prediabetes  . Anxiety disorder Father   . Depression Father   . Diabetes Maternal Grandmother   . Diabetes Paternal Grandmother   . Hypertension Paternal Grandmother   . Lupus Paternal Grandmother   . Depression Paternal Grandmother   . Anxiety disorder Paternal Grandmother   . Pulmonary embolism Sister 20       surgery on leg veins  . Colon cancer Paternal Grandfather   . ADD / ADHD Sister     Social History   Socioeconomic History  . Marital status: Single    Spouse name: Not on file  . Number of children: Not on file  . Years of education: Not on file  . Highest education level: Not on file  Occupational History  . Not on file  Social Needs  . Financial resource strain: Not on file  . Food insecurity    Worry: Not on  file    Inability: Not on file  . Transportation needs    Medical: Not on file    Non-medical: Not on file  Tobacco Use  . Smoking status: Never Smoker  . Smokeless tobacco: Never Used  Substance and Sexual Activity  . Alcohol use: Yes    Alcohol/week: 0.0 - 1.0 standard drinks    Comment: rarely  . Drug use: Yes    Types: Marijuana  . Sexual activity: Yes    Partners: Male    Birth control/protection: None, Condom  Lifestyle  . Physical activity    Days per week: Not on file    Minutes per session: Not on file  . Stress: Not on file  Relationships  . Social Musicianconnections    Talks on phone: Not on file    Gets together: Not on file    Attends religious service: Not on file    Active member of club or organization: Not on file    Attends meetings of clubs or organizations: Not on file    Relationship status: Not on file  . Intimate partner violence    Fear of current or ex partner: Not on file    Emotionally abused: Not on file    Physically abused: Not on file    Forced sexual activity: Not on file  Other Topics Concern  . Not on file  Social History Narrative  . Not on file    Review of Systems  Constitutional: Negative.   HENT: Negative.   Eyes: Negative.   Respiratory: Negative.   Cardiovascular: Negative.   Gastrointestinal: Negative.   Endocrine: Negative.   Genitourinary:       Vaginal odor Pelvic pain  Musculoskeletal: Negative.   Skin: Negative.   Allergic/Immunologic: Negative.   Neurological: Negative.   Hematological: Negative.   Psychiatric/Behavioral: Negative.     PHYSICAL EXAMINATION:    BP 110/64 (BP Location: Right Arm, Patient Position: Sitting, Cuff Size: Normal)   Pulse (!) 14   Temp 98 F (36.7 C) (Temporal)   Resp 14   Ht 5\' 7"  (1.702 m)   Wt 153 lb 3.2 oz (69.5 kg)   LMP 05/08/2019   BMI 23.99 kg/m     General appearance: alert, cooperative and appears stated age Head: Normocephalic, without  obvious abnormality,  atraumatic Neck: no adenopathy, supple, symmetrical, trachea midline and thyroid normal to inspection and palpation Lungs: clear to auscultation bilaterally Heart: regular rate and rhythm Abdomen: soft, non-tender, no masses,  no organomegaly Extremities: extremities normal, atraumatic, no cyanosis or edema No abnormal inguinal nodes palpated Neurologic: Grossly normal  Pelvic: External genitalia:  no lesions              Urethra:  normal appearing urethra with no masses, tenderness or lesions              Bartholins and Skenes: normal                 Vagina: normal appearing vagina with normal color and discharge, no lesions              Cervix: no lesions                Bimanual Exam:  Uterus:  normal size, contour, position, consistency, mobility, mildly tender.  No CMT.              Adnexa: no mass, fullness, tenderness               Chaperone was present for exam.  ASSESSMENT  Recent miscarriage. Hx migraine without aura.  Menstrual migraine.  Dysmenorrhea. Menorrhagia with irregular menses.  Depression and anxiety.  FH of clotting disorder. Sister with hx PE.   PLAN  We discussed heavy and painful menses.  We reviewed early miscarriage and aneuploidy as the likely cause. Will check CBC, ferritin, iron, and TSH.  Return for pelvic US.  I discussed Mirena IUD with estrogen supplementation during cycle or continuous OCPs/NuvaRing.  UPT.  Rx Motrin.  She will get her refill of Imitrex through her PCP.  She will investigate further her sister's thromboembolic history and inquire it there was a specific diagnosis to explain the event.    An After Visit Summary was printed and given to the patient.  __30____ minutes face to face time of which over 50% was spent in counseling.

## 2019-06-01 LAB — FERRITIN: Ferritin: 14 ng/mL — ABNORMAL LOW (ref 15–150)

## 2019-06-01 LAB — CBC
Hematocrit: 37.2 % (ref 34.0–46.6)
Hemoglobin: 11.1 g/dL (ref 11.1–15.9)
MCH: 21.8 pg — ABNORMAL LOW (ref 26.6–33.0)
MCHC: 29.8 g/dL — ABNORMAL LOW (ref 31.5–35.7)
MCV: 73 fL — ABNORMAL LOW (ref 79–97)
Platelets: 307 10*3/uL (ref 150–450)
RBC: 5.09 x10E6/uL (ref 3.77–5.28)
RDW: 14.9 % (ref 11.7–15.4)
WBC: 7.7 10*3/uL (ref 3.4–10.8)

## 2019-06-01 LAB — IRON: Iron: 46 ug/dL (ref 27–159)

## 2019-06-01 LAB — TSH: TSH: 0.852 u[IU]/mL (ref 0.450–4.500)

## 2019-06-02 ENCOUNTER — Encounter: Payer: Self-pay | Admitting: Obstetrics and Gynecology

## 2019-06-02 ENCOUNTER — Telehealth: Payer: Self-pay | Admitting: Obstetrics and Gynecology

## 2019-06-02 NOTE — Telephone Encounter (Signed)
Spoke with patient and conveyed benefits for ultrasound. Patient understands/agreeeable with the benefits. Patient is aware of the cancellation policy.

## 2019-06-05 NOTE — Telephone Encounter (Signed)
Patient cancelled ultrasound appointment for Thursday and would like to reschedule.

## 2019-06-05 NOTE — Telephone Encounter (Signed)
Spoke with patient. She needs to cx PUS on 06-08-19 because she has traffic court that AM. Rescheduled PUS/Consult with Dr.Silva for 06-22-19 10:00am. LMP 05-08-19. Date change sent to [facility]. Changed date on order. Routed to provider to sign and close encounter.

## 2019-06-05 NOTE — Telephone Encounter (Signed)
Thank you for the update!

## 2019-06-08 ENCOUNTER — Other Ambulatory Visit: Payer: Self-pay

## 2019-06-08 ENCOUNTER — Other Ambulatory Visit: Payer: Self-pay | Admitting: Obstetrics and Gynecology

## 2019-06-22 ENCOUNTER — Ambulatory Visit: Payer: Federal, State, Local not specified - PPO | Admitting: Obstetrics and Gynecology

## 2019-06-22 ENCOUNTER — Ambulatory Visit (INDEPENDENT_AMBULATORY_CARE_PROVIDER_SITE_OTHER): Payer: Federal, State, Local not specified - PPO

## 2019-06-22 ENCOUNTER — Other Ambulatory Visit: Payer: Self-pay

## 2019-06-22 ENCOUNTER — Other Ambulatory Visit: Payer: Federal, State, Local not specified - PPO

## 2019-06-22 ENCOUNTER — Encounter: Payer: Self-pay | Admitting: Obstetrics and Gynecology

## 2019-06-22 VITALS — BP 108/70 | HR 72 | Temp 97.4°F | Resp 12 | Ht 67.0 in | Wt 153.0 lb

## 2019-06-22 DIAGNOSIS — Z3009 Encounter for other general counseling and advice on contraception: Secondary | ICD-10-CM | POA: Diagnosis not present

## 2019-06-22 DIAGNOSIS — N921 Excessive and frequent menstruation with irregular cycle: Secondary | ICD-10-CM

## 2019-06-22 DIAGNOSIS — N946 Dysmenorrhea, unspecified: Secondary | ICD-10-CM | POA: Diagnosis not present

## 2019-06-22 NOTE — Progress Notes (Signed)
Encounter reviewed by Dr. Phiona Ramnauth Amundson C. Silva.  

## 2019-06-22 NOTE — Progress Notes (Signed)
GYNECOLOGY  VISIT   HPI: 23 y.o.   Single  African American  female   G1P0010 with Patient's last menstrual period was 06/06/2019.   here for ultrasound; patient is requesting STD testing, complains of having discharge and odor.    She has pelvic pain and painful periods.  Menses last 8 - 9 days and occur every 4 - 5 weeks.  Pad change every 1.5 hours.   Junel did not help her menstruation.   Menstrual migraines. Migraine without aura.   Hgb 11.1. Ferritin 14.  Normal TSH.   I recommended she start SloFe.  She is taking Macaroot.   Sister had a PE.    GYNECOLOGIC HISTORY: Patient's last menstrual period was 06/06/2019. Contraception:  Condoms Menopausal hormone therapy:  n/a Last mammogram:  n/a Last pap smear:   05/24/18 - negative - Care Everywhere        OB History    Gravida  1   Para      Term      Preterm      AB  1   Living  0     SAB  1   TAB      Ectopic      Multiple      Live Births           Obstetric Comments  Unknown gestation            Patient Active Problem List   Diagnosis Date Noted  . Family history of clotting disorder 06/01/2018  . 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 Migraines 04/03/2014    Past Medical History:  Diagnosis Date  . Anxiety   . Depression   . Headache(784.0)    without aura  . Iron deficiency   . Migraine     Past Surgical History:  Procedure Laterality Date  . ABDOMINAL HERNIA REPAIR Bilateral 01/1999   inguinal  . HERNIA REPAIR    . SHOULDER ARTHROSCOPY W/ LABRAL REPAIR Right    2014    Current Outpatient Medications  Medication Sig Dispense Refill  . Biotin w/ Vitamins C & E (HAIR/SKIN/NAILS PO) Take by mouth.    . busPIRone (BUSPAR) 5 MG tablet Take by mouth.    . CRANBERRY PO Take by mouth.    Marland Kitchen. ibuprofen (ADVIL) 800 MG tablet Take 1 tablet (800 mg total) by mouth every 8 (eight) hours as needed. 30 tablet 2  . Multiple Vitamins-Minerals (MULTIVITAMIN ADULT PO) Take by  mouth.    . naproxen (NAPROSYN) 500 MG tablet Take 1 tablet by mouth as needed.    . sertraline (ZOLOFT) 25 MG tablet Take 25 mg by mouth daily. Last taken 2 days ago due to pos HPT.    . SUMAtriptan (IMITREX) 50 MG tablet One tablet at the onset of HA, repeat dose in 2 hours if needed max dose 200mg  /24hours     No current facility-administered medications for this visit.      ALLERGIES: Latex and Tape  Family History  Problem Relation Age of Onset  . Post-traumatic stress disorder Father   . Hypertension Father   . Diabetes Father        prediabetes  . Anxiety disorder Father   . Depression Father   . Diabetes Maternal Grandmother   . Diabetes Paternal Grandmother   . Hypertension Paternal Grandmother   . Lupus Paternal Grandmother   . Depression Paternal Grandmother   . Anxiety disorder Paternal Grandmother   . Pulmonary embolism Sister  20       surgery on leg veins  . Colon cancer Paternal Grandfather   . ADD / ADHD Sister     Social History   Socioeconomic History  . Marital status: Single    Spouse name: Not on file  . Number of children: Not on file  . Years of education: Not on file  . Highest education level: Not on file  Occupational History  . Not on file  Social Needs  . Financial resource strain: Not on file  . Food insecurity    Worry: Not on file    Inability: Not on file  . Transportation needs    Medical: Not on file    Non-medical: Not on file  Tobacco Use  . Smoking status: Never Smoker  . Smokeless tobacco: Never Used  Substance and Sexual Activity  . Alcohol use: Yes    Alcohol/week: 0.0 - 1.0 standard drinks    Comment: rarely  . Drug use: Yes    Types: Marijuana  . Sexual activity: Yes    Partners: Male    Birth control/protection: None, Condom  Lifestyle  . Physical activity    Days per week: Not on file    Minutes per session: Not on file  . Stress: Not on file  Relationships  . Social Herbalist on phone: Not on  file    Gets together: Not on file    Attends religious service: Not on file    Active member of club or organization: Not on file    Attends meetings of clubs or organizations: Not on file    Relationship status: Not on file  . Intimate partner violence    Fear of current or ex partner: Not on file    Emotionally abused: Not on file    Physically abused: Not on file    Forced sexual activity: Not on file  Other Topics Concern  . Not on file  Social History Narrative  . Not on file    Review of Systems  Constitutional: Negative.   HENT: Negative.   Eyes: Negative.   Respiratory: Negative.   Cardiovascular: Negative.   Gastrointestinal: Negative.   Endocrine: Negative.   Genitourinary: Positive for vaginal discharge.       Vaginal odor  Musculoskeletal: Negative.   Skin: Negative.   Allergic/Immunologic: Negative.   Neurological: Negative.   Hematological: Negative.   Psychiatric/Behavioral: Negative.     PHYSICAL EXAMINATION:    BP 108/70 (BP Location: Right Arm, Patient Position: Sitting, Cuff Size: Normal)   Pulse 72   Temp (!) 97.4 F (36.3 C) (Temporal)   Resp 12   Ht 5\' 7"  (1.702 m)   Wt 153 lb (69.4 kg)   LMP 06/06/2019   BMI 23.96 kg/m     General appearance: alert, cooperative and appears stated age  Pelvic US Uterus no masses. EMS 13.25 mm.  Ovaries normal.  Left ovarian follicle.  No free fluid.   ASSESSMENT  Menorrhagia.  Dysmenorrhea. Recent SAB.  Anemia.   Migraine without aura.   PLAN  Reassurance regarding pelvic anatomy.  Return for STD and vaginitis testing as gel was used today for the pelvic US.  Mirena IUD risk and benefits discussed.  Will plan for Cytotec and paracervical block.    An After Visit Summary was printed and given to the patient.  _15_____ minutes face to face time of which over 50% was spent in counseling.

## 2019-06-28 NOTE — Telephone Encounter (Signed)
Call placed to convey benefits for Mirena IUD. Spoke with patient she understands/agreeable with benefits. Patient states she does not want to proceed with it right now. She is scheduled for aex on 07/17/19 and will discuss this at her appointment with Dr. Quincy Simmonds.

## 2019-06-28 NOTE — Telephone Encounter (Signed)
Encounter reviewed and closed.  

## 2019-07-13 ENCOUNTER — Other Ambulatory Visit: Payer: Self-pay

## 2019-07-17 ENCOUNTER — Other Ambulatory Visit: Payer: Self-pay

## 2019-07-17 ENCOUNTER — Ambulatory Visit (INDEPENDENT_AMBULATORY_CARE_PROVIDER_SITE_OTHER): Payer: Federal, State, Local not specified - PPO | Admitting: Certified Nurse Midwife

## 2019-07-17 ENCOUNTER — Other Ambulatory Visit (HOSPITAL_COMMUNITY)
Admission: RE | Admit: 2019-07-17 | Discharge: 2019-07-17 | Disposition: A | Discharge status: Home / Self Care | Payer: Federal, State, Local not specified - PPO | Source: Ambulatory Visit | Attending: Certified Nurse Midwife | Admitting: Certified Nurse Midwife

## 2019-07-17 ENCOUNTER — Encounter: Payer: Self-pay | Admitting: Certified Nurse Midwife

## 2019-07-17 VITALS — BP 104/62 | HR 68 | Temp 97.3°F | Resp 16 | Ht 65.75 in | Wt 155.0 lb

## 2019-07-17 DIAGNOSIS — Z01419 Encounter for gynecological examination (general) (routine) without abnormal findings: Secondary | ICD-10-CM

## 2019-07-17 DIAGNOSIS — N760 Acute vaginitis: Secondary | ICD-10-CM | POA: Insufficient documentation

## 2019-07-17 DIAGNOSIS — Z124 Encounter for screening for malignant neoplasm of cervix: Secondary | ICD-10-CM

## 2019-07-17 DIAGNOSIS — Z113 Encounter for screening for infections with a predominantly sexual mode of transmission: Secondary | ICD-10-CM

## 2019-07-17 NOTE — Progress Notes (Signed)
23 y.o. G1P0010 Single  African American Fe here for annual exam.Periods normal, 5 day duration,cramping mild, slightly heavy. Contraception condoms. Desires STD screening. Sees psychiatrist for Anxiety/depression management, all medication stable. History of migraine headache no aura. Declines labs for STD screening today. Will be moving to [place] soon, will establish care there and request records at that point. No other health issues today.  Patient's last menstrual period was 07/11/2019 (exact date).          Sexually active: Yes.    The current method of family planning is condoms all the time.    Exercising: Yes.    at home workout,walking Smoker:  no  Review of Systems  Constitutional: Negative.   HENT: Negative.   Eyes: Negative.   Respiratory: Negative.   Cardiovascular: Negative.   Gastrointestinal: Negative.   Genitourinary: Negative.   Musculoskeletal: Negative.   Skin: Negative.   Neurological: Negative.   Endo/Heme/Allergies: Negative.   Psychiatric/Behavioral: Negative.     Health Maintenance: Pap:  2019 neg per patient at another office History of Abnormal Pap: no MMG:  none Self Breast exams: no Colonoscopy:  none BMD:   none TDaP:  2017 Shingles: no Pneumonia: no Hep C and HIV: both neg 2020 Labs: if needed.   reports that she has never smoked. She has never used smokeless tobacco. She reports current alcohol use. She reports current drug use. Drug: Marijuana.  Past Medical History:  Diagnosis Date  . Anxiety   . Depression   . Headache(784.0)    without aura  . Iron deficiency   . Migraine     Past Surgical History:  Procedure Laterality Date  . ABDOMINAL HERNIA REPAIR Bilateral 01/1999   inguinal  . HERNIA REPAIR    . SHOULDER ARTHROSCOPY W/ LABRAL REPAIR Right    2014    Current Outpatient Medications  Medication Sig Dispense Refill  . Biotin w/ Vitamins C & E (HAIR/SKIN/NAILS PO) Take by mouth.    . busPIRone (BUSPAR) 5 MG tablet Take  by mouth.    . CRANBERRY PO Take by mouth.    Marland Kitchen. ibuprofen (ADVIL) 800 MG tablet Take 1 tablet (800 mg total) by mouth every 8 (eight) hours as needed. 30 tablet 2  . Multiple Vitamins-Minerals (MULTIVITAMIN ADULT PO) Take by mouth.    . naproxen (NAPROSYN) 500 MG tablet Take 1 tablet by mouth as needed.    . sertraline (ZOLOFT) 25 MG tablet Take 25 mg by mouth daily. Last taken 2 days ago due to pos HPT.    . SUMAtriptan (IMITREX) 50 MG tablet One tablet at the onset of HA, repeat dose in 2 hours if needed max dose 200mg  /24hours     No current facility-administered medications for this visit.     Family History  Problem Relation Age of Onset  . Post-traumatic stress disorder Father   . Hypertension Father   . Diabetes Father        prediabetes  . Anxiety disorder Father   . Depression Father   . Diabetes Maternal Grandmother   . Diabetes Paternal Grandmother   . Hypertension Paternal Grandmother   . Lupus Paternal Grandmother   . Depression Paternal Grandmother   . Anxiety disorder Paternal Grandmother   . Pulmonary embolism Sister 20       surgery on leg veins  . Colon cancer Paternal Grandfather   . ADD / ADHD Sister     ROS:  Pertinent items are noted in HPI.  Otherwise, a  comprehensive ROS was negative.  Exam:   BP 104/62   Pulse 68   Temp (!) 97.3 F (36.3 C) (Skin)   Resp 16   Ht 5' 5.75" (1.67 m)   Wt 155 lb (70.3 kg)   LMP 07/11/2019 (Exact Date)   BMI 25.21 kg/m  Height: 5' 5.75" (167 cm) Ht Readings from Last 3 Encounters:  07/17/19 5' 5.75" (1.67 m)  06/22/19 5\' 7"  (1.702 m)  05/31/19 5\' 7"  (1.702 m)    General appearance: alert, cooperative and appears stated age Head: Normocephalic, without obvious abnormality, atraumatic Neck: no adenopathy, supple, symmetrical, trachea midline and thyroid normal to inspection and palpation Lungs: clear to auscultation bilaterally Breasts: normal appearance, no masses or tenderness, No nipple retraction or  dimpling, No nipple discharge or bleeding, No axillary or supraclavicular adenopathy, Taught monthly breast self examination Heart: regular rate and rhythm Abdomen: soft, non-tender; no masses,  no organomegaly Extremities: extremities normal, atraumatic, no cyanosis or edema Skin: Skin color, texture, turgor normal. No rashes or lesions Lymph nodes: Cervical, supraclavicular, and axillary nodes normal. No abnormal inguinal nodes palpated Neurologic: Grossly normal   Pelvic: External genitalia:  no lesions              Urethra:  normal appearing urethra with no masses, tenderness or lesions              Bartholin's and Skene's: normal                 Vagina: normal appearing vagina with normal color and discharge, no lesions              Cervix: no bleeding following Pap, no cervical motion tenderness, no lesions and nulliparous appearance              Pap taken: Yes.   Bimanual Exam:  Uterus:  normal size, contour, position, consistency, mobility, non-tender and anteverted              Adnexa: normal adnexa and no mass, fullness, tenderness               Rectovaginal: Confirms               Anus:  normal appearance no lesions  Chaperone present: yes  A:  Well Woman with normal exam  Contraception condoms   STD screening  History of SAB earlier this year  Anxiety/depression with MD management  P:   Reviewed health and wellness pertinent to exam  Discussed importance of consistent use.  Discussed STD prevention and screening with partner change.  Coping well now.  Continue follow up as indicated  Lab: Affirm, GC/Chlamydia/Trichomonas  Pap smear: yes   counseled on breast self exam, STD prevention, HIV risk factors and prevention, feminine hygiene, adequate intake of calcium and vitamin D, diet and exercise  return annually or prn  An After Visit Summary was printed and given to the patient.

## 2019-07-19 LAB — VAGINITIS/VAGINOSIS, DNA PROBE
Candida Species: NEGATIVE
Gardnerella vaginalis: NEGATIVE
Trichomonas vaginosis: NEGATIVE

## 2019-07-19 LAB — CYTOLOGY - PAP
Chlamydia: NEGATIVE
Diagnosis: NEGATIVE
Neisseria Gonorrhea: NEGATIVE

## 2020-02-28 ENCOUNTER — Encounter: Payer: Self-pay | Admitting: Certified Nurse Midwife

## 2020-07-17 ENCOUNTER — Ambulatory Visit: Payer: Federal, State, Local not specified - PPO | Admitting: Certified Nurse Midwife
